# Patient Record
Sex: Female | Born: 1980 | Race: White | Hispanic: No | Marital: Married
Health system: Southern US, Community
[De-identification: ages and names within clinical notes are randomized; demographics above are authoritative.]

---

## 2007-10-04 ENCOUNTER — Observation Stay: Payer: Self-pay | Admitting: Obstetrics and Gynecology

## 2007-10-06 ENCOUNTER — Observation Stay: Payer: Self-pay

## 2007-10-18 ENCOUNTER — Inpatient Hospital Stay: Payer: Self-pay | Admitting: Obstetrics and Gynecology

## 2007-12-27 ENCOUNTER — Emergency Department: Payer: Self-pay | Admitting: Emergency Medicine

## 2007-12-27 ENCOUNTER — Other Ambulatory Visit: Payer: Self-pay

## 2009-09-16 ENCOUNTER — Emergency Department: Payer: Self-pay | Admitting: Emergency Medicine

## 2010-04-29 ENCOUNTER — Observation Stay: Payer: Self-pay

## 2010-04-30 ENCOUNTER — Ambulatory Visit: Payer: Self-pay | Admitting: Obstetrics and Gynecology

## 2010-05-14 ENCOUNTER — Inpatient Hospital Stay: Payer: Self-pay

## 2010-06-05 ENCOUNTER — Observation Stay: Payer: Self-pay

## 2010-06-23 ENCOUNTER — Encounter: Payer: Self-pay | Admitting: Maternal and Fetal Medicine

## 2010-06-30 ENCOUNTER — Inpatient Hospital Stay: Payer: Self-pay

## 2011-09-06 IMAGING — US US OB US >=[ID] SNGL FETUS
1 series · 17 of 28 positions shown · non-contrast
Comparison: None

REASON FOR EXAM: bleeding and 31 [DATE] weeks pregnant
COMMENTS:   LMP: > one month ago

PROCEDURE:     US  - US OB GREATER/OR EQUAL TO 1LFCL  - May 14, 2010  [DATE]
RESULT:     Indication: Bleeding.
TECHNIQUE: Multiple transabdominal gray-scale images and endovaginal
gray-scale images of the pelvis performed.

[Series 1: us ob us >=(id) sngl fetus · 17 of 100 slices shown]
[im 1/100]
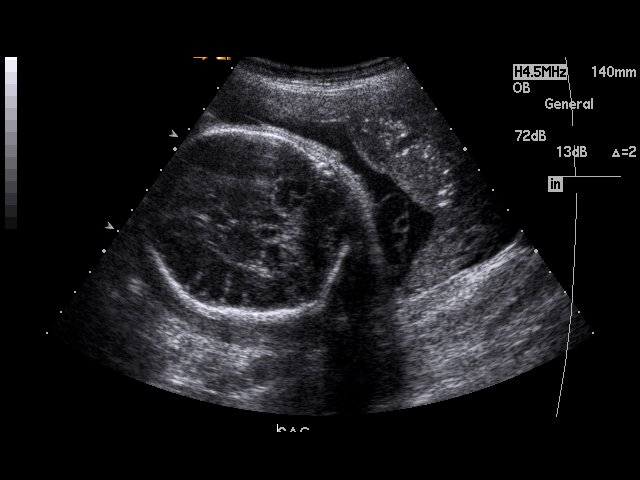
[im 8/100]
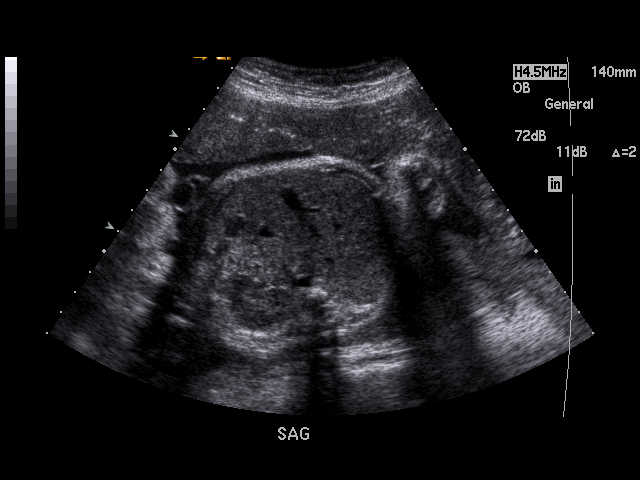
[im 15/100]
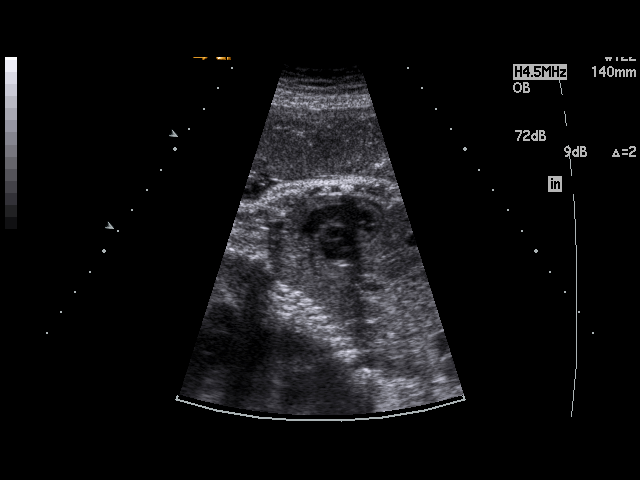
[im 19/100]
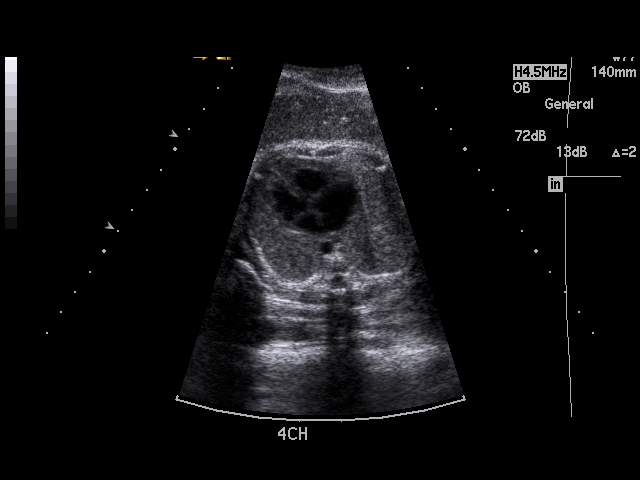
[im 26/100]
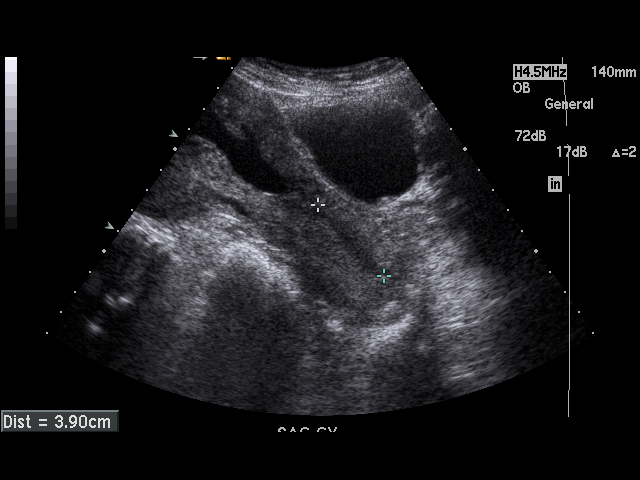
[im 34/100]
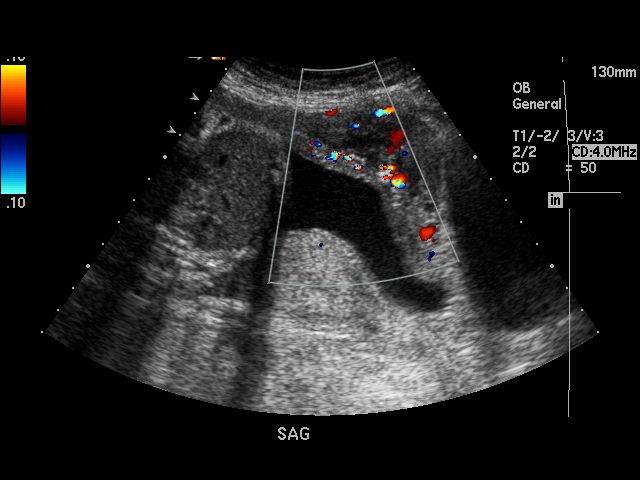
[im 37/100]
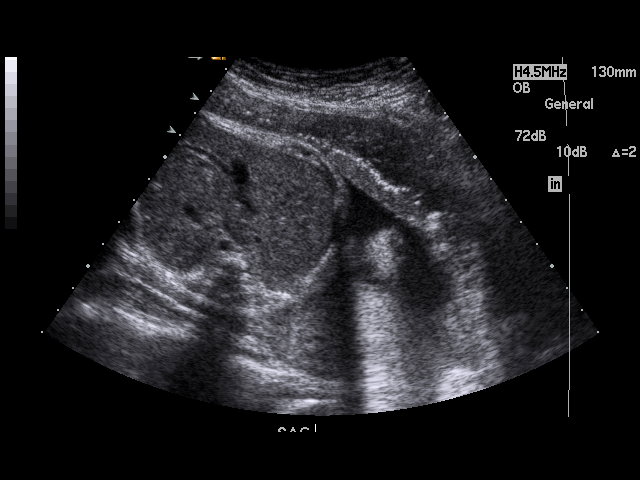
[im 45/100]
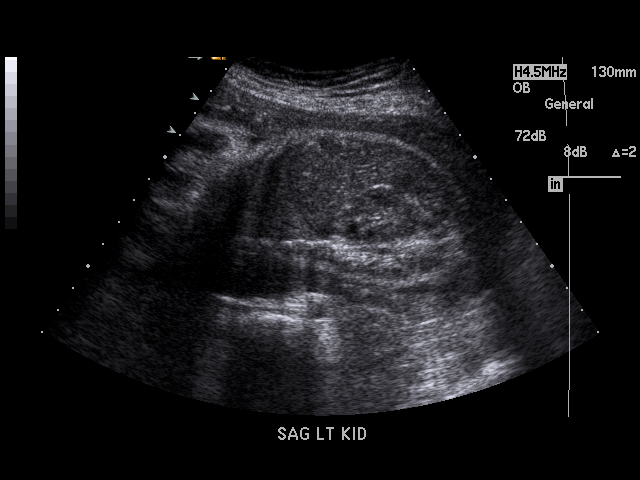
[im 52/100]
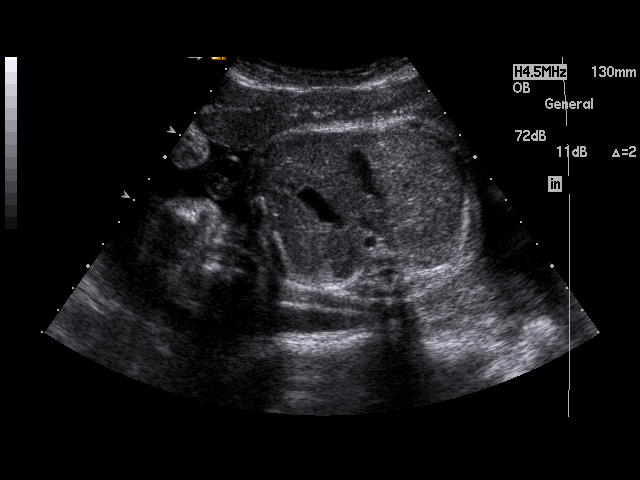
[im 56/100]
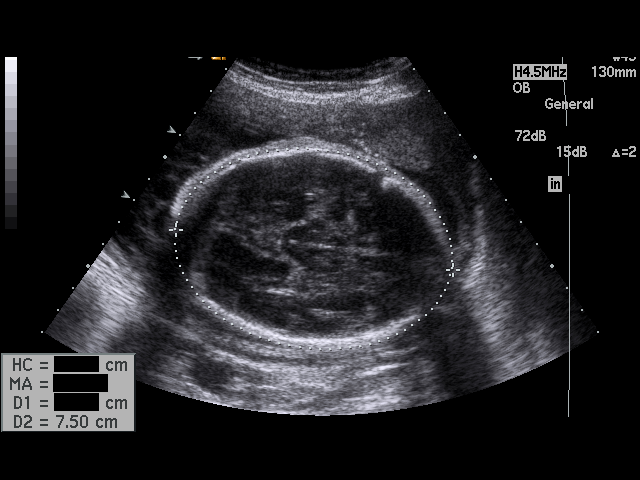
[im 63/100]
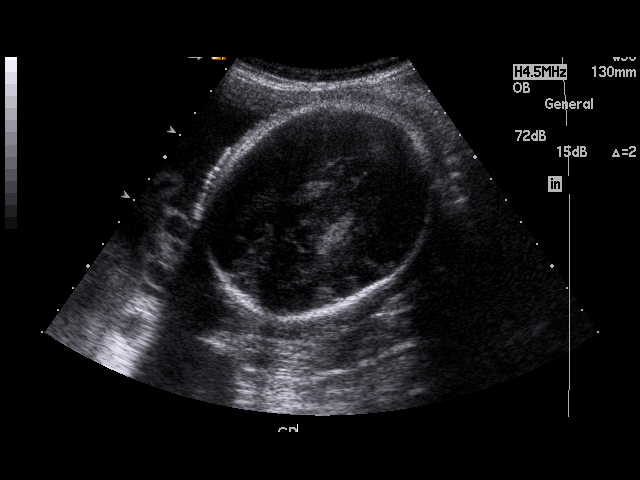
[im 67/100]
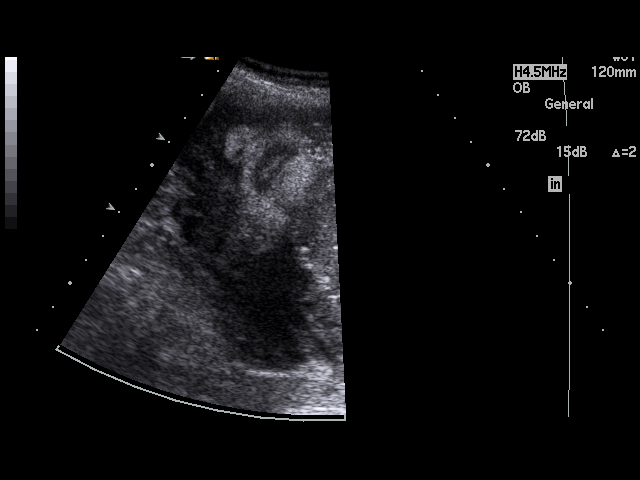
[im 74/100]
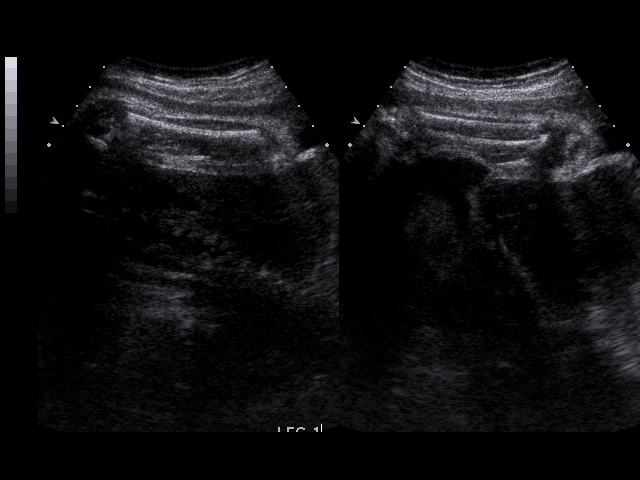
[im 81/100]
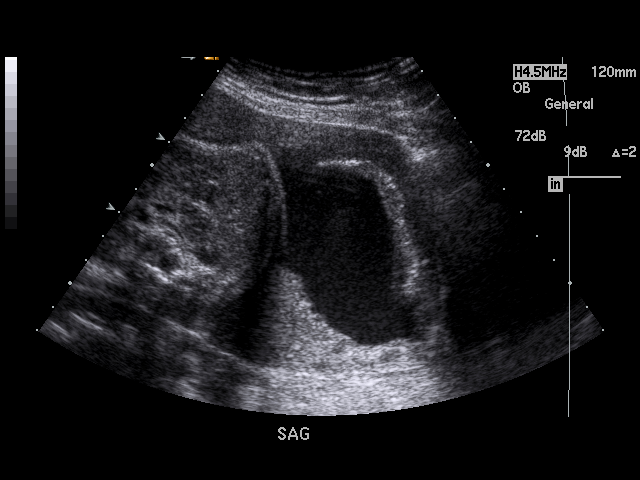
[im 85/100]
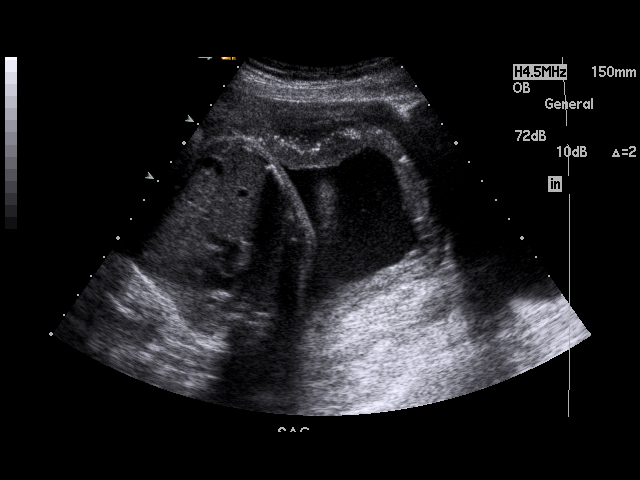
[im 92/100]
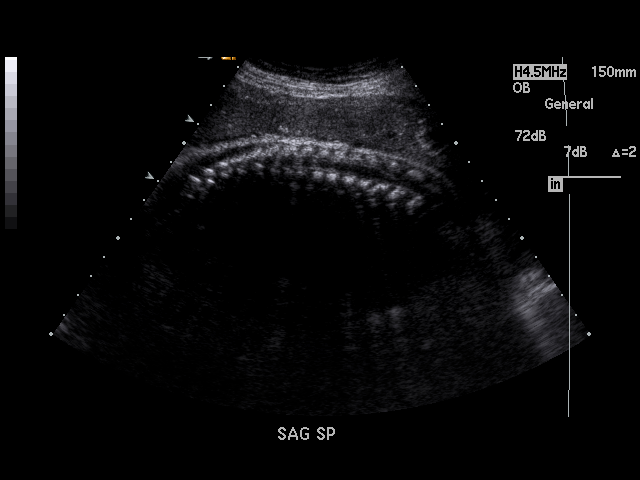
[im 100/100]
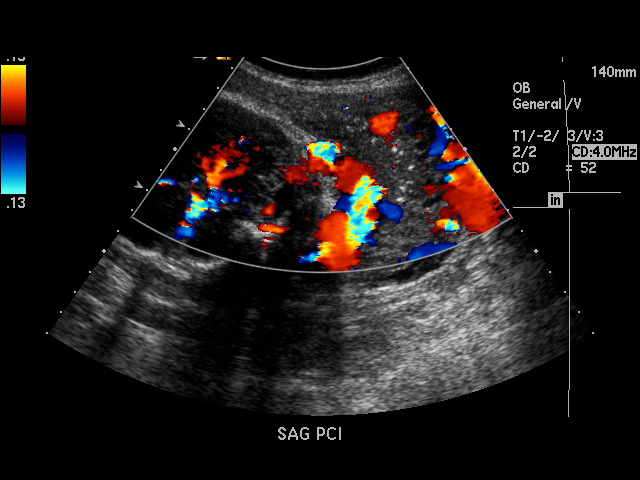

[17 of 28 positions shown; findings below may reference images not displayed]

FINDINGS: The examination was not performed as a fetal anatomic survey.

There is a single live intrauterine pregnancy dating 30 weeks 3 days with a
fetal heart rate of 153 beats per minute. The fetus is in breech
presentation. The cervical length is normal measuring 3.9 cm. There is a
trace amount of fluid in the endocervical canal. The placenta is anteriorly
located. The tip of the placenta is 1.7 cm from the internal os. There is a
7.3 x 1.6 cm hypoechoic area in the retroplacental lower segment of the
placenta most concerning for a retroplacental hematoma.

There is no adnexal mass.

There is no pelvic free fluid.
IMPRESSION: Single live intrauterine pregnancy dating 30 weeks 3 days.

A moderate-sized retroplacental hematoma. Correlate with nonstress test.
These findings were discussed with Dr. Lee on 05/14/2010 at 4333 hoursCST.

## 2012-10-27 ENCOUNTER — Emergency Department: Payer: Self-pay | Admitting: Emergency Medicine

## 2012-10-27 LAB — BASIC METABOLIC PANEL
Anion Gap: 8 (ref 7–16)
BUN: 4 mg/dL — ABNORMAL LOW (ref 7–18)
Creatinine: 0.5 mg/dL — ABNORMAL LOW (ref 0.60–1.30)
EGFR (African American): >60
EGFR (Non-African Amer.): >60
Glucose: 77 mg/dL (ref 65–99)

## 2012-10-27 LAB — CBC
MCH: 29.2 pg (ref 26.0–34.0)
MCV: 89 fL (ref 80–100)
RDW: 13.3 % (ref 11.5–14.5)
WBC: 10.7 10*3/uL (ref 3.6–11.0)

## 2012-10-27 LAB — HCG, QUANTITATIVE, PREGNANCY: Beta Hcg, Quant.: 68 m[IU]/mL — ABNORMAL HIGH

## 2013-07-19 ENCOUNTER — Other Ambulatory Visit: Payer: Self-pay

## 2013-07-19 LAB — PRENATAL PANEL
ABO/RH(D): O POS
Antibody Screen: NEGATIVE
HCT: 29.2 % — ABNORMAL LOW (ref 35.0–47.0)
HGB: 10.1 g/dL — ABNORMAL LOW (ref 12.0–16.0)
MCH: 31.2 pg (ref 26.0–34.0)
MCHC: 34.5 g/dL (ref 32.0–36.0)
Platelet: 206 10*3/uL (ref 150–440)
RBC: 3.22 10*6/uL — ABNORMAL LOW (ref 3.80–5.20)
RDW: 13.3 % (ref 11.5–14.5)

## 2013-11-11 ENCOUNTER — Inpatient Hospital Stay: Payer: Self-pay

## 2013-11-11 LAB — CBC WITH DIFFERENTIAL/PLATELET
Basophil #: 0.1 10*3/uL (ref 0.0–0.1)
Basophil %: 0.6 %
EOS ABS: 0.1 10*3/uL (ref 0.0–0.7)
Eosinophil %: 0.2 %
HCT: 34.7 % — ABNORMAL LOW (ref 35.0–47.0)
HGB: 11.2 g/dL — AB (ref 12.0–16.0)
LYMPHS ABS: 3.5 10*3/uL (ref 1.0–3.6)
Lymphocyte %: 15.9 %
MCH: 30 pg (ref 26.0–34.0)
MCHC: 32.1 g/dL (ref 32.0–36.0)
MCV: 93 fL (ref 80–100)
MONO ABS: 0.9 x10 3/mm (ref 0.2–0.9)
MONOS PCT: 4.2 %
NEUTROS ABS: 17.4 10*3/uL — AB (ref 1.4–6.5)
Neutrophil %: 79.1 %
PLATELETS: 191 10*3/uL (ref 150–440)
RBC: 3.72 10*6/uL — ABNORMAL LOW (ref 3.80–5.20)
RDW: 14.8 % — AB (ref 11.5–14.5)
WBC: 22.1 10*3/uL — ABNORMAL HIGH (ref 3.6–11.0)

## 2013-11-12 LAB — HEMATOCRIT: HCT: 30.8 % — AB (ref 35.0–47.0)

## 2013-11-15 LAB — PATHOLOGY REPORT

## 2014-04-26 ENCOUNTER — Emergency Department: Payer: Self-pay | Admitting: Emergency Medicine

## 2014-05-04 ENCOUNTER — Ambulatory Visit: Payer: Self-pay | Admitting: Unknown Physician Specialty

## 2014-08-02 ENCOUNTER — Emergency Department: Payer: Self-pay | Admitting: Emergency Medicine

## 2015-01-05 NOTE — Op Note (Signed)
PATIENT NAME:  Sue Morris, Sue Morris MR#:  [id number] DATE OF BIRTH:  1981-03-07  DATE OF PROCEDURE:  11/11/2013  PREOPERATIVE DIAGNOSES: Term intrauterine pregnancy, prior history of cesarean section, desire for permanent sterility; patient is in labor.    POSTOPERATIVE DIAGNOSES: Term intrauterine pregnancy, prior history of cesarean section, desire for permanent sterility; patient is in labor.    PROCEDURES PERFORMED:  1.  Low transverse cesarean section.  2.  Bilateral tubal ligation.  3.  Placement of On-Q pain pump.   SURGEON: Annamarie MajorPaul Jamilah Jean, M.D.   ASSISTANT: Midwife Sharen HonesGutierrez.   ANESTHESIA: Spinal.   ESTIMATED BLOOD LOSS: 250 mL.   COMPLICATIONS: None.   FINDINGS: Normal tubes, ovaries, and uterus. Delivery involved a viable female infant weighing 6 pounds with Apgar scores of 8 and 9 at 1 and 5 minutes, respectively.   DISPOSITION: To the recovery room in stable condition.   TECHNIQUE: The patient is prepped and draped in the usual sterile fashion, after adequate anesthesia is obtained in the supine position on the Operating Room table. Skin incisions created with a scalpel through the area of the prior scar down to the level of the rectus fascia. The fascia was then dissected bilaterally using Mayo scissors. The rectus muscles are dissected away from the rectus fascia and then separated in the midline. The peritoneum was penetrated and the bladder was inferiorly retracted and resected. A scalpel was used to create a low transverse hysterotomy incision that is then extended by blunt dissection with amniotomy revealing clear fluid. The infant's head was grasped and delivered with suctioning of the oropharynx. Infant is completely delivered and handed to the pediatric team.   Cord blood is obtained and the placenta is manually extracted. The uterus is externalized and cleansed of all membranes and debris, using a moist sponge. The hysterotomy incision is closed with a running #1 Vicryl suture  in a locking fashion with excellent hemostasis noted.   The left and right fallopian tubes are grasped with a Babcock clamp and a loop is tied with 2 Vicryl sutures, excised and cauterized. Excellent hemostasis is noted. The uterus is placed back in the intraabdominal cavity and the pericolic gutters are irrigated with warm saline. Re-examination of all incision reveals excellent hemostasis. Interceed is placed over the lower uterine incision to minimize adhesion formation.   The peritoneum is closed with a Vicryl suture. Trocars are placed through the abdomen into the subfascial space, which then guide the placement of the Silver soaker catheters related to the On-Q pain pump.  Maxon sutures then used to close the rectus fascia with careful placement to avoid involvement of these catheters. The subcutaneous tissues are irrigated and hemostasis is assured using electrocautery. Skin is closed with surgical clips and the Silver soaker catheters with the On-Q pain pump system are flushed with 5 mL each of bupivacaine and stabilized in place with Steri-Strips and a bandage. The patient goes to the recovery room in stable condition. All sponge, instrument and needle counts are correct.    ____________________________ R. Annamarie MajorPaul Rishaan Gunner, MD rph:NTS D: 11/11/2013 03:56:44 ET T: 11/11/2013 05:25:36 ET JOB#: [id number]  cc: Dierdre Searles. Paul Myosha Cuadras, MD, <Dictator> Nadara MustardOBERT P Lauren Modisette MD ELECTRONICALLY SIGNED 11/14/2013 1:32

## 2015-01-22 NOTE — H&P (Signed)
L&D Evaluation:  History:  HPI 34 year old G8 [id number] with EDC=11/28/2013 by a 21 wk6 day ultrasound presents to L&D with c/o onset contractions at 6PM last night. Hx of 2 prior C-sections, she was scheduled for a repeat CS and BTL on 11/21/2013. [facility] and has also been remarkable for tobacco use, poor weight gain (8#), an elevated one hr GTT of 143 with a normal 3 hr GTT, and an ultrasound 1/23 with EFW 4#4oz (41.8%).  LABS: O POS, RI, VI, GBS negative. TDAP given 09/22/2013   Presents with contractions   Patient's Medical History Bipolar disorder   Patient's Surgical History prior CS x2 (first for compound presentation and second for IUGR/NRFHT).   Medications Pre Natal Vitamins  Tylenol (Acetaminophen)  Zantac 150 mgm BID prn   Allergies NKDA   Social History tobacco  < 1/2 PPD   Family History Non-Contributory   ROS:  ROS see HPI   Exam:  Vital Signs 131/73   Urine Protein not completed   General breathing thru some contractions   Mental Status clear   Chest clear   Heart normal sinus rhythm, no murmur/gallop/rubs   Abdomen gravid, tender with contractions   Estimated Fetal Weight Small for gestational age   Fetal Position cephalic   Edema no edema   Reflexes 3+   Pelvic no external lesions, 3/C/BBOW/-2   Mebranes Intact   FHT 135-140 with accels to 160s   Ucx q4   Skin dry   Impression:  Impression IUP at 37 4/7 weeks in labor. H/O prior CS x2-desires repeat and BTL   Plan:  Plan EFM/NST, Dr Tiburcio PeaHarris consulted. Prepare for CS   Comments Reviewed risks of CS including risks of bleeding, infection, injury to organs in pelvis like bowel or bladder or other blood vessels and risk of anesthesia. She has signed her 30 day Medicaid papers for her BTL and understands this is for permanent sterilization, but that there is a 1/300 chance of conceiving after a BTL. Consent also obtained for blood transfusion in case of an  emergency.   Electronic Signatures: Trinna BalloonGutierrez, Janalyn Higby L (CNM)  (Signed 28-Feb-15 02:22)  Authored: L&D Evaluation   Last Updated: 28-Feb-15 02:22 by Trinna BalloonGutierrez, Daveena Elmore L (CNM)
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
## 2019-11-24 ENCOUNTER — Other Ambulatory Visit: Payer: Self-pay

## 2019-11-24 DIAGNOSIS — M25562 Pain in left knee: Secondary | ICD-10-CM | POA: Diagnosis not present

## 2019-11-24 DIAGNOSIS — M79605 Pain in left leg: Secondary | ICD-10-CM | POA: Insufficient documentation

## 2019-11-24 DIAGNOSIS — M25512 Pain in left shoulder: Secondary | ICD-10-CM | POA: Diagnosis present

## 2019-11-24 DIAGNOSIS — Y92014 Private driveway to single-family (private) house as the place of occurrence of the external cause: Secondary | ICD-10-CM | POA: Insufficient documentation

## 2019-11-24 DIAGNOSIS — Y998 Other external cause status: Secondary | ICD-10-CM | POA: Diagnosis not present

## 2019-11-24 DIAGNOSIS — Y9389 Activity, other specified: Secondary | ICD-10-CM | POA: Insufficient documentation
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
## 2019-11-25 ENCOUNTER — Emergency Department: Payer: No Typology Code available for payment source

## 2019-11-25 ENCOUNTER — Encounter: Payer: Self-pay | Admitting: Emergency Medicine

## 2019-11-25 ENCOUNTER — Emergency Department
Admission: EM | Admit: 2019-11-25 | Discharge: 2019-11-25 | Disposition: A | Discharge status: Home / Self Care | Payer: No Typology Code available for payment source | Attending: Emergency Medicine | Admitting: Emergency Medicine

## 2019-11-25 DIAGNOSIS — M79605 Pain in left leg: Secondary | ICD-10-CM

## 2019-11-25 MED ORDER — KETOROLAC TROMETHAMINE 60 MG/2ML IM SOLN
30.0000 mg | Freq: Once | INTRAMUSCULAR | Status: AC
  Administered 2019-11-25: 30 mg via INTRAMUSCULAR
  Filled 2019-11-25: qty 2
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
## 2019-11-25 NOTE — ED Notes (Signed)
Pt transported to xray 
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
